# Patient Record
Sex: Male | Born: 1960 | Race: White | Hispanic: No | Marital: Married | Smoking: Former smoker
Health system: Southern US, Community
[De-identification: ages and names within clinical notes are randomized; demographics above are authoritative.]

## PROBLEM LIST (undated history)

## (undated) DIAGNOSIS — I739 Peripheral vascular disease, unspecified: Secondary | ICD-10-CM

## (undated) DIAGNOSIS — E119 Type 2 diabetes mellitus without complications: Secondary | ICD-10-CM

## (undated) HISTORY — DX: Peripheral vascular disease, unspecified: I73.9

---

## 2015-05-15 ENCOUNTER — Other Ambulatory Visit: Payer: Self-pay | Admitting: Physical Medicine and Rehabilitation

## 2015-05-15 DIAGNOSIS — M5416 Radiculopathy, lumbar region: Secondary | ICD-10-CM

## 2015-05-16 ENCOUNTER — Other Ambulatory Visit: Payer: Self-pay | Admitting: Physical Medicine and Rehabilitation

## 2015-05-16 ENCOUNTER — Ambulatory Visit
Admission: RE | Admit: 2015-05-16 | Discharge: 2015-05-16 | Disposition: A | Discharge status: Home / Self Care | Payer: 59 | Source: Ambulatory Visit | Attending: Physical Medicine and Rehabilitation | Admitting: Physical Medicine and Rehabilitation

## 2015-05-16 DIAGNOSIS — M5416 Radiculopathy, lumbar region: Secondary | ICD-10-CM

## 2015-05-16 HISTORY — PX: EPIDURAL BLOCK INJECTION: SHX1516

## 2015-05-16 MED ORDER — IOHEXOL 180 MG/ML  SOLN
1.0000 mL | Freq: Once | INTRAMUSCULAR | Status: DC | PRN
  Administered 2015-05-16: 1 mL via EPIDURAL

## 2015-05-16 MED ORDER — METHYLPREDNISOLONE ACETATE 40 MG/ML INJ SUSP (RADIOLOG
120.0000 mg | Freq: Once | INTRAMUSCULAR | Status: AC
  Administered 2015-05-16: 120 mg via EPIDURAL

## 2015-05-16 NOTE — Discharge Instructions (Signed)

## 2015-05-25 ENCOUNTER — Ambulatory Visit (HOSPITAL_BASED_OUTPATIENT_CLINIC_OR_DEPARTMENT_OTHER)
Admission: RE | Admit: 2015-05-25 | Discharge: 2015-05-25 | Disposition: A | Discharge status: Home / Self Care | Payer: 59 | Source: Ambulatory Visit | Attending: Neurosurgery | Admitting: Neurosurgery

## 2015-05-25 ENCOUNTER — Other Ambulatory Visit (HOSPITAL_BASED_OUTPATIENT_CLINIC_OR_DEPARTMENT_OTHER): Payer: Self-pay | Admitting: Neurosurgery

## 2015-05-25 ENCOUNTER — Encounter (HOSPITAL_BASED_OUTPATIENT_CLINIC_OR_DEPARTMENT_OTHER): Payer: Self-pay

## 2015-05-25 DIAGNOSIS — R1900 Intra-abdominal and pelvic swelling, mass and lump, unspecified site: Secondary | ICD-10-CM

## 2015-05-25 DIAGNOSIS — K760 Fatty (change of) liver, not elsewhere classified: Secondary | ICD-10-CM | POA: Insufficient documentation

## 2015-05-25 DIAGNOSIS — R109 Unspecified abdominal pain: Secondary | ICD-10-CM | POA: Diagnosis present

## 2015-05-25 HISTORY — DX: Type 2 diabetes mellitus without complications: E11.9

## 2015-05-25 MED ORDER — IOHEXOL 300 MG/ML  SOLN
100.0000 mL | Freq: Once | INTRAMUSCULAR | Status: AC | PRN
  Administered 2015-05-25: 100 mL via INTRAVENOUS

## 2015-05-29 ENCOUNTER — Encounter: Payer: Self-pay | Admitting: Vascular Surgery

## 2015-05-30 ENCOUNTER — Encounter: Payer: Self-pay | Admitting: Vascular Surgery

## 2015-05-31 ENCOUNTER — Ambulatory Visit (INDEPENDENT_AMBULATORY_CARE_PROVIDER_SITE_OTHER): Payer: 59 | Admitting: Vascular Surgery

## 2015-05-31 ENCOUNTER — Other Ambulatory Visit: Payer: Self-pay

## 2015-05-31 ENCOUNTER — Encounter: Payer: Self-pay | Admitting: Vascular Surgery

## 2015-05-31 ENCOUNTER — Telehealth: Payer: Self-pay | Admitting: Vascular Surgery

## 2015-05-31 VITALS — BP 125/87 | HR 111 | Temp 98.2°F | Resp 18 | Ht 71.0 in | Wt 133.0 lb

## 2015-05-31 DIAGNOSIS — G8929 Other chronic pain: Secondary | ICD-10-CM

## 2015-05-31 DIAGNOSIS — R1031 Right lower quadrant pain: Secondary | ICD-10-CM | POA: Diagnosis not present

## 2015-05-31 DIAGNOSIS — R109 Unspecified abdominal pain: Secondary | ICD-10-CM

## 2015-05-31 NOTE — Telephone Encounter (Signed)
notified patient's wife of appointment at [facility] for an gastric emptying study, gave instructions:  NPO after midnight, go to 1st Radiology Dept. at 6:45am

## 2015-05-31 NOTE — Progress Notes (Signed)
[facility] [place] HISTORY AND PHYSICAL   History of Present Illness:  Patient is a 54 y.o. male who presents for evaluation of weight loss and right side pain. The patient complains of right lower quadrant/trunk pain which occurs with touching the skin or muscles on the right side. He does not really describe postprandial abdominal pain. He was recently evaluated by Dr. Dutch QuintPoole for back pain and is being considered for lumbar spine surgery. He has also lost 50 pounds in the last 8 months. He has also had a recent diagnosis of diabetes. Workup for his weight loss consisted of an endocrinologic workup as well as a colonoscopy. All of these have not really pointed to any cause of the weight loss. The colonoscopy was done in July at [facility]. The patient states a polyp was removed otherwise the colonoscopy was normal. The patient does have early satiety. He does not describe food fear. The patient states he has otherwise been healthy. He is a former smoker and quit 2 years ago. He denies history of elevated cholesterol or hypertension. He denies nausea vomiting diarrhea or constipation symptoms. He has had no prior abdominal surgery.  Past Medical History  Diagnosis Date  . Diabetes mellitus without complication (HCC)   . Peripheral vascular disease [facility](HCC)     Past Surgical History  Procedure Laterality Date  . Epidural block injection  05-16-15    Back    Social History Social History  Substance Use Topics  . Smoking status: Former Smoker -- 1.00 packs/day for 35 years    Types: Cigarettes    Quit date: 03/15/2013  . Smokeless tobacco: Never Used  . Alcohol Use: No    Family History Family History  Problem Relation Age of Onset  . Cancer Mother   . Diabetes Mother   . Heart disease Mother     After age 52  . Cancer Father   . Diabetes Father   . Diabetes Brother     Allergies  Allergies  Allergen Reactions  . Prednisone     Other reaction(s): Other (See  Comments) "Diabetic state"     Current Outpatient Prescriptions  Medication Sig Dispense Refill  . cyclobenzaprine (FLEXERIL) 10 MG tablet Take 2-3 per day  0  . HYDROcodone-acetaminophen (NORCO) 10-325 MG tablet every 1 hour x 6 doses.  0  . metFORMIN (GLUCOPHAGE) 500 MG tablet Take 1,000 mg by mouth.    . pioglitazone (ACTOS) 15 MG tablet Take 15 mg by mouth.     No current facility-administered medications for this visit.    ROS:   General:  + weight loss, Fever, chills  HEENT: No recent headaches, no nasal bleeding, no visual changes, no sore throat  Neurologic: No dizziness, blackouts, seizures. No recent symptoms of stroke or mini- stroke. No recent episodes of slurred speech, or temporary blindness.  Cardiac: No recent episodes of chest pain/pressure, no shortness of breath at rest.  No shortness of breath with exertion.  Denies history of atrial fibrillation or irregular heartbeat  Vascular: No history of rest pain in feet.  No history of claudication.  No history of non-healing ulcer, No history of DVT   Pulmonary: No home oxygen, no productive cough, no hemoptysis,  No asthma or wheezing  Musculoskeletal:  [ ]  Arthritis, [ ]  Low back pain,  [ ]  Joint pain  Hematologic:No history of hypercoagulable state.  No history of easy bleeding.  No history of anemia  Gastrointestinal: No hematochezia or melena,  No gastroesophageal reflux, no trouble swallowing  Urinary:  chronic Kidney disease,  on HD -  MWF or  TTHS,  Burning with urination,  Frequent urination,  Difficulty urinating;   Skin: No rashes  Psychological: No history of anxiety,  No history of depression   Physical Examination  Filed Vitals:   05/31/15 1332  BP: 125/87  Pulse: 111  Temp: 98.2 F (36.8 C)  TempSrc: Oral  Resp: 18  Height:  (1.803 m)  Weight: 133 lb (60.328 kg)  SpO2: 100%    Body mass index is 18.56 kg/(m^2).  General:  Alert and oriented, no acute  distress HEENT: Normal Neck: No bruit or JVD Pulmonary: Clear to auscultation bilaterally Cardiac: Regular Rate and Rhythm without murmur Abdomen: Soft, non-tender, non-distended, no mass, no scars Skin: No rash Extremity Pulses:  2+ radial, brachial, femoral, dorsalis pedis, posterior tibial pulses bilaterally Musculoskeletal: No deformity or edema  Neurologic: Upper and lower extremity motor 5/5 and symmetric  DATA:  I reviewed the patient's recent CT scan of the abdomen and pelvis dated 05/25/2015. This shows an irregular narrowing of the celiac artery approximately 80%. Superior mesenteric artery is widely patent. The inferior mesenteric artery is patent but small but not really much smaller than most people's inferior mesenteric artery.  ASSESSMENT:  Unexplained weight loss with right-sided abdominal pain although the patient describes this more as musculoskeletal pain rather than visceral pain. 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 However his superior mesenteric artery is widely patent and his IMA is patent as well. It would be unusual to have this type of weight loss symptoms from an isolated celiac artery stenosis.   PLAN:  We will order a gastric emptying study to see whether or not gastroparesis may be part of his problem secondary to his diabetes. If the gastric emptying study is negative. He may need further evaluation by his GI doctor before we would consider his celiac artery stenosis as the source of his weight loss. The patient will return for follow-up after his gastric emptying study.  Fabienne Bruns, MD [facility] [place] Office: [phone] Pager: [phone]

## 2015-06-08 ENCOUNTER — Encounter: Payer: Self-pay | Admitting: Neurology

## 2015-06-08 ENCOUNTER — Ambulatory Visit (INDEPENDENT_AMBULATORY_CARE_PROVIDER_SITE_OTHER): Payer: 59 | Admitting: Neurology

## 2015-06-08 VITALS — BP 132/85 | HR 102 | Ht 71.0 in | Wt 134.8 lb

## 2015-06-08 DIAGNOSIS — M5416 Radiculopathy, lumbar region: Secondary | ICD-10-CM | POA: Diagnosis not present

## 2015-06-08 DIAGNOSIS — R634 Abnormal weight loss: Secondary | ICD-10-CM

## 2015-06-08 DIAGNOSIS — R29898 Other symptoms and signs involving the musculoskeletal system: Secondary | ICD-10-CM | POA: Diagnosis not present

## 2015-06-08 DIAGNOSIS — M6250 Muscle wasting and atrophy, not elsewhere classified, unspecified site: Secondary | ICD-10-CM

## 2015-06-08 DIAGNOSIS — G609 Hereditary and idiopathic neuropathy, unspecified: Secondary | ICD-10-CM | POA: Diagnosis not present

## 2015-06-08 MED ORDER — GABAPENTIN 300 MG PO CAPS: 300.0000 mg | ORAL_CAPSULE | Freq: Three times a day (TID) | ORAL | Status: DC

## 2015-06-08 NOTE — Patient Instructions (Signed)
As far as your medications are concerned, I would like to suggest: Gabapentin 300mg  three times daily  As far as diagnostic testing: labs  I would like to see you back in 3 months, sooner if we need to. Please call us with any interim questions, concerns, problems, updates or refill requests.   Please also call us for any test results so we can go over those with you on the phone.  My clinical assistant and will answer any of your questions and relay your messages to me and also relay most of my messages to you.   Our phone number is [phone]. We also have an after hours call service for urgent matters and there is a physician on-call for urgent questions. For any emergencies you know to call 911 or go to the nearest emergency room

## 2015-06-08 NOTE — Progress Notes (Addendum)
[facility]    Provider:  Dr Micheal Richardson Referring Provider: Delilah Shan, MD Primary Care Physician:  Micheal Simmer, MD  CC:  Peripheral polyneuropathy  HPI:  Micheal Richardson is a 54 y.o. male here as a referral from Dr. Claiborne Richardson for peripheral polyneuropathy. He was recently diagnosed with diabetes with a hemoglobin A1c. 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 He had lost 30 pounds before being diagnosed and has lost 50 pounds altogether. Patient has numbness and tingling in the feet which started back in June. Started after lifting water out of the back of the car and afterwards with back pain, numbness and tinlging on the right foot at the toes. Pain shoots down the side of the right leg through the thigh and all the way down the lateral part of the leg to the toes. He was diagnosed with diabetes in June as well. His HgbA1c was 11.2 and his glucose was 375. He has had a 50 pound weight loss. His primary care has been evaluating patient for this weight loss. Patient has an endocrinologist who has worked him up for possible malignancy, has had extensive imaging and pet scans and colonoscopy. He has not had an MRI of the brain. He has had B12 and folate and TSH labs and has been tested extensively. He describes no cramping in the feet. He feels weakness distally in the right foot. He has no symptoms in the left leg foot. The pad of the right foot feels swollen. Balance is fine. His hands and feet get cold. No paresthesias or numbness in the left toes or any fingers. No other focal neurologic symptoms.  Reviewed notes, labs and imaging from outside physicians, which showed: Patient was seen at [facility]. He was seen there for severe right-sided low back pain shooting into the right lower extremity into the sole of the foot. He has had L4/L5, L5-S1 and S1 transforaminal epidurals at [facility] that did not give him any relief whatsoever. He had an MRI revealing a small right  L5-S1 paracentral disc protrusion with S1 encroachment in the lateral recess. During this time he was also diagnosed with diabetes in June with rather high elevated sugars with an A1c in the 11 range. He was also experiencing rapid weight loss which unfortunately continues. He was sent to vascular surgery for celiac artery stenosis seen on an abdominal CT scan.  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 Findings are fairly symmetric bilaterally which included absent sural sensory responses, prolonged motor latencies with leg nerve conductions slowing, chronic neurogenic motor unit changes with motor unit dropout in the foot intrinsics. Consistent with diabetes also many other etiologies including a neoplastic process. Study was negative for any lumbar radiculopathy, plexopathy, peripheral tibial or peroneal mononeuropathy as well as myopathy.  Personally reviewed data from the electrodiagnostic laboratory report and agree with conclusion detailed above. Personally reviewed MRI of the lumbar spine images a patient brought on disc and agree with report above.  CMP unremarkable showed creatinine of 0.84. TSH within normal limits.  Review of Systems: Patient complains of symptoms per HPI as well as the following symptoms: Weight loss, fatigue, numbness, weakness. Pertinent negatives per HPI. All others negative.   Social History   Social History  . Marital Status: Married    Spouse Name: N/A  . Number of Children: N/A  . Years of Education: N/A   Occupational History  .  Not on file.   Social History Main Topics  . Smoking status: Former Smoker -- 1.00 packs/day for 35 years    Types: Cigarettes    Quit date: 03/15/2013  . Smokeless tobacco: Never Used  . Alcohol Use: No  . Drug Use: No  . Sexual Activity: Not on file   Other Topics Concern  . Not  on file   Social History Narrative    Family History  Problem Relation Age of Onset  . Cancer Mother   . Diabetes Mother   . Heart disease Mother     After age 51  . Cancer Father   . Diabetes Father   . Diabetes Brother   . Neuropathy Neg Hx     Past Medical History  Diagnosis Date  . Diabetes mellitus without complication ([place])   . Peripheral vascular disease [facility])     Past Surgical History  Procedure Laterality Date  . Epidural block injection  05-16-15    Back    Current Outpatient Prescriptions  Medication Sig Dispense Refill  . Blood Glucose Monitoring Suppl (ONETOUCH VERIO FLEX SYSTEM) W/DEVICE KIT 1 each by Does not apply route.    . cyclobenzaprine (FLEXERIL) 10 MG tablet Take 2-3 per day  0  . HYDROcodone-acetaminophen (NORCO) 10-325 MG tablet Take 1 tablet by mouth every 6 (six) hours as needed.   0  . metFORMIN (GLUCOPHAGE) 500 MG tablet Take 2,000 mg by mouth.     Glory Rosebush DELICA LANCETS 40C MISC USE TID  5  . pioglitazone (ACTOS) 30 MG tablet Take 30 mg by mouth.    . gabapentin (NEURONTIN) 300 MG capsule Take 1 capsule (300 mg total) by mouth 3 (three) times daily. 90 capsule 11   No current facility-administered medications for this visit.    Allergies as of 06/08/2015 - Review Complete 06/08/2015  Allergen Reaction Noted  . Prednisone Other (See Comments) 05/29/2015    Vitals: BP 132/85 mmHg  Pulse 102  Ht 5' 11"  (1.803 m)  Wt 134 lb 12.8 oz (61.145 kg)  BMI 18.81 kg/m2 Last Weight:  Wt Readings from Last 1 Encounters:  06/08/15 134 lb 12.8 oz (61.145 kg)   Last Height:   Ht Readings from Last 1 Encounters:  06/08/15 5' 11"  (1.803 m)    Physical exam: Exam: Gen: NAD, conversant, well nourised, obese, well groomed                     CV: RRR, no MRG. No Carotid Bruits. No peripheral edema, warm, nontender Eyes: Conjunctivae clear without exudates or hemorrhage  Neuro: Detailed Neurologic Exam  Speech:    Speech is normal; fluent  and spontaneous with normal comprehension.  Cognition:    The patient is oriented to person, place, and time;     recent and remote memory intact;     language fluent;     normal attention, concentration,     fund of knowledge Cranial Nerves:    The pupils are equal, round, and reactive to light. The fundi are flat. Visual fields are full to finger confrontation. Extraocular movements are intact. Trigeminal sensation is intact and the muscles of mastication are normal. The face is symmetric. The palate elevates in the midline. Hearing intact. Voice is normal. Shoulder shrug is normal. The tongue has normal motion without fasciculations.   Coordination:    Normal finger to nose and heel to shin.  Gait:    Heel-toe  are normal.   Motor  Observation:    Generalized atrophy. no involuntary movements noted. Tone:    Normal muscle tone.    Posture:    Posture is normal. normal erect    Strength:     left deltoid 4+/5, Right deltoid 4/5 otherwise strength is V/V in the upper and lower limbs.      Sensation: Dec pin prick and temp to ankles, intact in the upper extremities distally. Vibration and proprioception intact distally.      Reflex Exam:  DTR's:     Deep tendon reflexes in the lower extremities are absent bilaterally.  DTRs in the uppers are 1+. Toes:    The toes are downgoing bilaterally.   Clonus:    Clonus is absent.   Assessment/Plan:   54 y.o. male here as a referral from Dr. Claiborne Richardson for peripheral polyneuropathy. He was recently diagnosed with diabetes with a hemoglobin A1c of over 11 and celiac artery stenosis. He had lost 30 pounds before being diagnosed and has lost 50 pounds altogether. He describes low back pain and right sided radicular symptoms radiating posteriorly down to the right foot. He denies any paresthesias in the other limbs but does report coldness in the feet and hands.   Patient does have a distal peripheral polyneuropathy, more small fiber on clinical  exam however on EMG/nerve conduction studies showed sensory motor involvement. This is likely due to his diabetes, unclear how long it was uncontrolled for, however will order extensive peripheral neuropathy screen including paraneoplastic screen given his weight loss  However the right-sided leg symptoms do sound like S1 nerve root involvement related to his right L5-S1 paracentral disc herniation and are unlikely caused by his distal axonal sensorimotor peripheral polyneuropathy.   -Physical therapy for radicular pain in the right leg and weakness -Peripheral Neuropathy: will order extensive peripheral neuropathy screen including paraneoplastic screen given his weight loss -Right-sided leg radicular symptoms: do sound like S1 nerve root involvement related to his right L5-S1 paracentral disc herniation - Will start Neurontin for pain, can titrate up as needed - Refer to Dr. Brien Few for Evaluation and treatment, and for S1 nerve root injection  CC: Dr. Claiborne Richardson, Dr. Philemon Kingdom, MD  [facility] [address] [place], [place] [address]  Phone [phone] Fax [phone]

## 2015-06-09 ENCOUNTER — Encounter: Payer: Self-pay | Admitting: Neurology

## 2015-06-11 ENCOUNTER — Telehealth: Payer: Self-pay | Admitting: Neurology

## 2015-06-11 ENCOUNTER — Encounter: Payer: Self-pay | Admitting: Neurology

## 2015-06-11 NOTE — Addendum Note (Signed)
Addended by: Naomie DeanAHERN, Marquie Aderhold B on: 06/11/2015 10:02 PM   Modules accepted: Orders

## 2015-06-11 NOTE — Telephone Encounter (Signed)
Alwyn RenEmma and Daniel:  Patient needs to be referred to pain management ([facility]: Callie FieldingBartko Albert MD). He is emailing about the status. Can you get this referral to Dr. Murray HodgkinsBartko on Monday please? Then Kara Meadmma can call patient and let him know that the referral has been faxed so he can call dr. Murray HodgkinsBartko please? Dr. Murray HodgkinsBartko is aware and is waiting on the referral. Include my office note. emm thanks.   Thank you both!

## 2015-06-12 ENCOUNTER — Ambulatory Visit (HOSPITAL_COMMUNITY)
Admission: RE | Admit: 2015-06-12 | Discharge: 2015-06-12 | Disposition: A | Discharge status: Home / Self Care | Payer: 59 | Source: Ambulatory Visit | Attending: Vascular Surgery | Admitting: Vascular Surgery

## 2015-06-12 DIAGNOSIS — R109 Unspecified abdominal pain: Secondary | ICD-10-CM

## 2015-06-12 DIAGNOSIS — R634 Abnormal weight loss: Secondary | ICD-10-CM | POA: Insufficient documentation

## 2015-06-12 MED ORDER — TECHNETIUM TC 99M SULFUR COLLOID: 2.0000 | Freq: Once | INTRAVENOUS | Status: DC | PRN

## 2015-06-12 NOTE — Telephone Encounter (Signed)
Sent message to pt through mychart to respond to question on referral.

## 2015-06-12 NOTE — Telephone Encounter (Signed)
Sent referral to Dr Murray HodgkinsBartko. Received fax confirmation.

## 2015-06-12 NOTE — Telephone Encounter (Signed)
Thank you :)

## 2015-06-13 ENCOUNTER — Other Ambulatory Visit: Payer: Self-pay | Admitting: Neurology

## 2015-06-13 ENCOUNTER — Encounter: Payer: Self-pay | Admitting: Vascular Surgery

## 2015-06-13 MED ORDER — GABAPENTIN 300 MG PO CAPS: 600.0000 mg | ORAL_CAPSULE | Freq: Three times a day (TID) | ORAL | Status: DC

## 2015-06-14 LAB — HEAVY METALS, BLOOD
ARSENIC: 5 ug/L (ref 2–23)
Lead, Blood: NOT DETECTED ug/dL (ref 0–19)
MERCURY: NOT DETECTED ug/L (ref 0.0–14.9)

## 2015-06-14 LAB — COMPREHENSIVE METABOLIC PANEL
ALT: 18 IU/L (ref 0–44)
AST: 14 IU/L (ref 0–40)
Albumin/Globulin Ratio: 1.6 (ref 1.1–2.5)
Albumin: 4.2 g/dL (ref 3.5–5.5)
Alkaline Phosphatase: 80 IU/L (ref 39–117)
BILIRUBIN TOTAL: 0.4 mg/dL (ref 0.0–1.2)
BUN/Creatinine Ratio: 19 (ref 9–20)
BUN: 18 mg/dL (ref 6–24)
CHLORIDE: 100 mmol/L (ref 97–106)
CO2: 27 mmol/L (ref 18–29)
Calcium: 10.1 mg/dL (ref 8.7–10.2)
Creatinine, Ser: 0.95 mg/dL (ref 0.76–1.27)
GFR calc non Af Amer: 92 mL/min/{1.73_m2} (ref >59)
GFR, EST AFRICAN AMERICAN: 106 mL/min/{1.73_m2} (ref >59)
GLUCOSE: 159 mg/dL — AB (ref 65–99)
Globulin, Total: 2.7 g/dL (ref 1.5–4.5)
Potassium: 5.3 mmol/L — ABNORMAL HIGH (ref 3.5–5.2)
Sodium: 141 mmol/L (ref 136–144)
TOTAL PROTEIN: 6.9 g/dL (ref 6.0–8.5)

## 2015-06-14 LAB — MULTIPLE MYELOMA PANEL, SERUM
ALBUMIN/GLOB SERPL: 1.3 (ref 0.7–1.7)
ALPHA 1: 0.2 g/dL (ref 0.0–0.4)
ALPHA2 GLOB SERPL ELPH-MCNC: 0.7 g/dL (ref 0.4–1.0)
Albumin SerPl Elph-Mcnc: 3.8 g/dL (ref 2.9–4.4)
B-GLOBULIN SERPL ELPH-MCNC: 1.2 g/dL (ref 0.7–1.3)
GLOBULIN, TOTAL: 3.1 g/dL (ref 2.2–3.9)
Gamma Glob SerPl Elph-Mcnc: 0.9 g/dL (ref 0.4–1.8)
IGA/IMMUNOGLOBULIN A, SERUM: 368 mg/dL (ref 90–386)
IGG (IMMUNOGLOBIN G), SERUM: 793 mg/dL (ref 700–1600)
IGM (IMMUNOGLOBULIN M), SRM: 69 mg/dL (ref 20–172)

## 2015-06-14 LAB — HEPATITIS C ANTIBODY

## 2015-06-14 LAB — ANA COMPREHENSIVE PANEL
Anti JO-1: <0.2 AI (ref 0.0–0.9)
Chromatin Ab SerPl-aCnc: <0.2 AI (ref 0.0–0.9)
DSDNA AB: 1 [IU]/mL (ref 0–9)
ENA SM Ab Ser-aCnc: <0.2 AI (ref 0.0–0.9)
ENA SSA (RO) Ab: <0.2 AI (ref 0.0–0.9)
Scleroderma SCL-70: <0.2 AI (ref 0.0–0.9)

## 2015-06-14 LAB — B12 AND FOLATE PANEL
Folate: 19.9 ng/mL (ref >3.0)
Vitamin B-12: 545 pg/mL (ref 211–946)

## 2015-06-14 LAB — GLIADIN ANTIBODIES, SERUM
ANTIGLIADIN ABS, IGA: 9 U (ref 0–19)
GLIADIN IGG: 2 U (ref 0–19)

## 2015-06-14 LAB — VITAMIN B1: THIAMINE: 161.5 nmol/L (ref 66.5–200.0)

## 2015-06-14 LAB — HIV ANTIBODY (ROUTINE TESTING W REFLEX): HIV Screen 4th Generation wRfx: NONREACTIVE

## 2015-06-14 LAB — ANA: ANA TITER 1: NEGATIVE

## 2015-06-14 LAB — SEDIMENTATION RATE: Sed Rate: 2 mm/hr (ref 0–30)

## 2015-06-14 LAB — VITAMIN B6: Vitamin B6: 15.9 ug/L (ref 5.3–46.7)

## 2015-06-14 LAB — B. BURGDORFI ANTIBODIES: Lyme IgG/IgM Ab: <0.91 {ISR} (ref 0.00–0.90)

## 2015-06-14 LAB — RHEUMATOID FACTOR: Rhuematoid fact SerPl-aCnc: <10 IU/mL (ref 0.0–13.9)

## 2015-06-14 LAB — PARANEOPLASTIC PROFILE 1

## 2015-06-14 LAB — RPR: RPR Ser Ql: NONREACTIVE

## 2015-06-14 LAB — TISSUE TRANSGLUTAMINASE, IGA: Transglutaminase IgA: 5 U/mL — ABNORMAL HIGH (ref 0–3)

## 2015-06-14 LAB — METHYLMALONIC ACID, SERUM: METHYLMALONIC ACID: 186 nmol/L (ref 0–378)

## 2015-06-15 ENCOUNTER — Ambulatory Visit (INDEPENDENT_AMBULATORY_CARE_PROVIDER_SITE_OTHER): Payer: 59 | Admitting: Vascular Surgery

## 2015-06-15 ENCOUNTER — Encounter: Payer: Self-pay | Admitting: Vascular Surgery

## 2015-06-15 VITALS — BP 128/87 | HR 94 | Ht 71.0 in | Wt 135.4 lb

## 2015-06-15 DIAGNOSIS — R1010 Upper abdominal pain, unspecified: Secondary | ICD-10-CM

## 2015-06-15 NOTE — Progress Notes (Signed)
Patient is a 54 year old male who returns for follow-up today. 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 At his previous office visit I sent him for a gastric emptying study based on his symptoms of early satiety and weight loss. He also has a history of diabetes. He reports since I saw him in the office several weeks ago his symptoms have basically resolved at this point. The current thinking is that he may have been having an interaction between tramadol and metformin. His appetite has returned. He is gaining weight. He has no further abdominal pain.   Review of systems: He denies constipation or diarrhea. He denies any GI symptoms at all of this point.   Physical exam:  Filed Vitals:   06/15/15 1149  BP: 128/87  Pulse: 94  Height: 5\' 11"  (1.803 m)  Weight: 135 lb 6.4 oz (61.417 kg)  SpO2: 100%     Data: Gastric emptying study results were reviewed this was a normal study. No evidence of gastric emptying delay.  Assessment: abdominal pain and weight loss symptoms currently resolved. Patient with isolated celiac artery stenosis. It is rare that isolated celiac artery stenosis can cause mesenteric ischemia symptoms. If the patient's symptoms return before consideration on an intervention for mesenteric artery stenosis he would need a full GI workup to rule out other possible causes as again isolated celiac artery stenosis would be a rare cause of abdominal pain. All this was discussed with the patient today. He will follow-up with me on an as-needed basis.   Plan: See above  Fabienne Brunsharles Fields, MD [facility] [place] Office: [phone] Pager: [phone]

## 2015-06-19 ENCOUNTER — Telehealth: Payer: Self-pay | Admitting: *Deleted

## 2015-06-19 ENCOUNTER — Other Ambulatory Visit: Payer: Self-pay | Admitting: Neurology

## 2015-06-19 MED ORDER — PREGABALIN 75 MG PO CAPS: 75.0000 mg | ORAL_CAPSULE | Freq: Two times a day (BID) | ORAL | Status: DC

## 2015-06-19 NOTE — Telephone Encounter (Signed)
Patient was given the message that his results were unremarkable and accepted information with understanding. He would like for the doctor to know that his Rx Gabapentin is not helping and would like to try another medication.  Thanks!

## 2015-06-19 NOTE — Telephone Encounter (Signed)
We can try some Lyrica. Let him know please. Will start low at 75mg  BID and can increase from there. Will fax it in for him in the morning. Side effects to Lyrica includes serious reaction such as hypersensitivity, angioedema, Stevens-Johnson syndrome and rash, rhabdomyolysis, suicidality as well as the common reactions which include dizziness, somnolence, dry mouth, peripheral edema, weight gain, abnormal thinking, constipation, pain, impaired coordination and decreased platelets.

## 2015-06-19 NOTE — Telephone Encounter (Signed)
LVM for pt to call about results. Gave [facility] phone number and hours. Okay to inform pt labs unremarkable.

## 2015-06-19 NOTE — Telephone Encounter (Signed)
-----   Message from Anson FretAntonia B Ahern, MD sent at 06/18/2015  6:43 PM EST ----- Let patient know all labs were unremarkable.Thanks

## 2015-06-20 ENCOUNTER — Other Ambulatory Visit: Payer: Self-pay | Admitting: Neurology

## 2015-06-20 MED ORDER — DULOXETINE HCL 60 MG PO CPEP: 60.0000 mg | ORAL_CAPSULE | Freq: Every day | ORAL | Status: DC

## 2015-06-20 NOTE — Telephone Encounter (Signed)
Micheal Richardson, let him know I called him in cymbalta. The most common side effects of Cymbalta are nausea, dry mouth, constipation, diarrhea, fatigue, drowsiness, difficulty sleeping, loss of appetite, and dizziness. Some patients may experience withdrawal reactions such anxiety, nausea, nervousness, and insomnia

## 2015-06-20 NOTE — Telephone Encounter (Signed)
LVM for pt to call back. Gave [facility] phone number and hours.  

## 2015-06-20 NOTE — Telephone Encounter (Signed)
Pt returned call. Relayed Dr Lucia GaskinsAhern message below. He is hesitant about starting Lyrica because he has spoken with several people who have tried this but had bad side effects from medication. I went over common/serious side effects. He wanted to see if there was another medication Dr Lucia GaskinsAhern could try before Lyrica. I told him I will speak with her and call him back. He verbalized understanding. He said if there was not another option, he will go ahead and try the Lyrica.

## 2015-06-21 ENCOUNTER — Other Ambulatory Visit: Payer: Self-pay | Admitting: *Deleted

## 2015-06-21 NOTE — Telephone Encounter (Signed)
Patient returned call

## 2015-06-21 NOTE — Telephone Encounter (Signed)
LVM for pharmacist at pt pharmacy to cx rx lyrica per pt request. Pt does not want to try this medication, but the cymbalta instead. Gave pt name and DOB and [facility] phone number if they have further questions.

## 2015-06-21 NOTE — Telephone Encounter (Signed)
Called pt and relayed Dr Lucia GaskinsAhern message about Cymbalta and side effects that can occur. Told him to call us if he experiences any. Ready to be picked up at pharmacy. Told him I will call his pharmacy to let him know I will tell them to not fill the Lyrica. He verbalized understanding.

## 2015-06-21 NOTE — Telephone Encounter (Signed)
LVM for pt to call back. Gave [facility] phone number and hours.  

## 2015-07-12 ENCOUNTER — Other Ambulatory Visit: Payer: Self-pay | Admitting: *Deleted

## 2015-07-12 DIAGNOSIS — R1012 Left upper quadrant pain: Principal | ICD-10-CM

## 2015-07-12 DIAGNOSIS — G8929 Other chronic pain: Secondary | ICD-10-CM

## 2015-07-12 DIAGNOSIS — R1011 Right upper quadrant pain: Principal | ICD-10-CM

## 2015-08-31 ENCOUNTER — Encounter: Payer: Self-pay | Admitting: Neurology

## 2015-08-31 ENCOUNTER — Ambulatory Visit (INDEPENDENT_AMBULATORY_CARE_PROVIDER_SITE_OTHER): Payer: 59 | Admitting: Neurology

## 2015-08-31 VITALS — BP 107/66 | HR 86 | Ht 71.0 in | Wt 140.8 lb

## 2015-08-31 DIAGNOSIS — G2581 Restless legs syndrome: Secondary | ICD-10-CM

## 2015-08-31 DIAGNOSIS — E0842 Diabetes mellitus due to underlying condition with diabetic polyneuropathy: Secondary | ICD-10-CM | POA: Diagnosis not present

## 2015-08-31 DIAGNOSIS — E1140 Type 2 diabetes mellitus with diabetic neuropathy, unspecified: Secondary | ICD-10-CM | POA: Insufficient documentation

## 2015-08-31 MED ORDER — ROTIGOTINE 1 MG/24HR TD PT24: 1.0000 | MEDICATED_PATCH | Freq: Every evening | TRANSDERMAL | Status: DC | PRN

## 2015-08-31 NOTE — Progress Notes (Signed)
[id number]    Provider:  Dr Jaynee Eagles Referring Provider: Delilah Shan, MD Primary Care Physician:  Nilda Simmer, MD  CC: Peripheral polyneuropathy  Interval history 08/31/2015: He has gained weight. Feeling better. Pain in the legs is ok, he still has some tingling but he is doing wll on the cymbalta 65m. When he is sleeping at night his leg feel restless. He is constabtly tapping and moving legs. He had ESI with Dr. BBrien Fewand felt much better for his radiculopathy in the pelvic joint. He played golf last Friday.   HPI: Micheal Richardson a 55y.o. male here as a referral from Dr. KClaiborne Billingsfor peripheral polyneuropathy. He was recently diagnosed with diabetes with a hemoglobin A1c. 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 He had lost 30 pounds before being diagnosed and has lost 50 pounds altogether. Micheal Richardson has numbness and tingling in the feet which started back in June. Started after lifting water out of the back of the car and afterwards with back pain, numbness and tinlging on the right foot at the toes. Pain shoots down the side of the right leg through the thigh and all the way down the lateral part of the leg to the toes. He was diagnosed with diabetes in June as well. His HgbA1c was 11.2 and his glucose was 375. He has had a 50 pound weight loss. His primary care has been evaluating Micheal Richardson for this weight loss. Micheal Richardson has an endocrinologist who has worked him up for possible malignancy, has had extensive imaging and pet scans and colonoscopy. He has not had an MRI of the brain. He has had B12 and folate and TSH labs and has been tested extensively. He describes no cramping in the feet. He feels weakness distally in the right foot. He has no symptoms in the left leg foot. The pad of the right foot feels swollen. Balance is fine. His hands and feet get cold. No paresthesias or numbness in the left toes or any fingers. No other focal neurologic symptoms.  Reviewed notes, labs and imaging  from outside physicians, which showed: [facility] He was seen there for severe right-sided low back pain shooting into the right lower extremity into the sole of the foot. He has had L4/L5, L5-S1 and [facility] [facility] whatsoever. He had an MRI revealing a small right L5-S1 paracentral disc protrusion with S1 encroachment in the lateral recess. During this time he was also diagnosed with diabetes in June with rather high elevated sugars with an A1c in the 11 range. He was also experiencing rapid weight loss which unfortunately continues. He was sent to vascular surgery for celiac artery stenosis seen on an abdominal CT scan.  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 Findings are fairly symmetric bilaterally which included absent sural sensory responses, prolonged motor latencies with leg nerve conductions slowing, chronic neurogenic motor unit changes with motor unit dropout in the foot intrinsics. Consistent with diabetes also many other etiologies including a neoplastic process. Study was negative for any lumbar radiculopathy, plexopathy, peripheral tibial or peroneal mononeuropathy as well as myopathy.  Personally reviewed data from the electrodiagnostic laboratory report and agree with conclusion detailed above. Personally reviewed MRI of the lumbar spine images a Micheal Richardson brought on disc and agree with report above.  CMP unremarkable showed creatinine of  0.84. TSH within normal limits.  Review of Systems: Micheal Richardson complains of symptoms per HPI as well as the following symptoms: Weight loss, fatigue, numbness, weakness. Pertinent negatives per HPI. All others negative.    Social History   Social History  . Marital Status: Married    Spouse Name: N/A    . Number of Children: N/A  . Years of Education: N/A   Occupational History  . Not on file.   Social History Main Topics  . Smoking status: Former Smoker -- 1.00 packs/day for 35 years    Types: Cigarettes    Quit date: 03/15/2013  . Smokeless tobacco: Never Used  . Alcohol Use: No  . Drug Use: No  . Sexual Activity: Not on file   Other Topics Concern  . Not on file   Social History Narrative    Family History  Problem Relation Age of Onset  . Cancer Mother   . Diabetes Mother   . Heart disease Mother     After age 7  . Cancer Father   . Diabetes Father   . Diabetes Brother   . Neuropathy Neg Hx     Past Medical History  Diagnosis Date  . Diabetes mellitus without complication ([place])   . Peripheral vascular disease [facility])     Past Surgical History  Procedure Laterality Date  . Epidural block injection  05-16-15    Back    Current Outpatient Prescriptions  Medication Sig Dispense Refill  . Blood Glucose Monitoring Suppl (ONETOUCH VERIO FLEX SYSTEM) W/DEVICE KIT 1 each by Does not apply route.    . DULoxetine (CYMBALTA) 60 MG capsule Take 1 capsule (60 mg total) by mouth daily. 30 capsule 11  . metFORMIN (GLUCOPHAGE) 500 MG tablet Take 2,000 mg by mouth.     Glory Rosebush DELICA LANCETS 20N MISC USE TID  5   No current facility-administered medications for this visit.    Allergies as of 08/31/2015 - Review Complete 08/31/2015  Allergen Reaction Noted  . Prednisone Other (See Comments) 05/29/2015    Vitals: BP 107/66 mmHg  Pulse 86  Ht 5' 11"  (1.803 m)  Wt 140 lb 12.8 oz (63.866 kg)  BMI 19.65 kg/m2 Last Weight:  Wt Readings from Last 1 Encounters:  08/31/15 140 lb 12.8 oz (63.866 kg)   Last Height:   Ht Readings from Last 1 Encounters:  08/31/15 5' 11"  (1.803 m)     Physical exam: Exam: Gen: NAD, conversant  CV: RRR, no MRG. No Carotid Bruits. No peripheral edema, warm, nontender Eyes: Conjunctivae clear without exudates or  hemorrhage  Neuro: Detailed Neurologic Exam  Speech:  Speech is normal; fluent and spontaneous with normal comprehension.  Cognition:  The Micheal Richardson is oriented to person, place, and time;   recent and remote memory intact;   language fluent;   normal attention, concentration,   fund of knowledge Cranial Nerves:  The pupils are equal, round, and reactive to light. Visual fields are full to finger confrontation. Extraocular movements are intact. Trigeminal sensation is intact and the muscles of mastication are normal. The face is symmetric. The palate elevates in the midline. Hearing intact. Voice is normal. Shoulder shrug is normal. The tongue has normal motion without fasciculations.   Motor Observation:  Generalized atrophy. no involuntary movements noted. Tone:  Normal muscle tone.   Posture:  Posture is normal. normal erect   Strength:  left deltoid 4+/5, Right deltoid 4/5 otherwise strength is V/V in the upper and lower limbs.  Sensation: Dec pin prick and temp to ankles, intact in the upper extremities distally. Vibration and proprioception intact distally.    Reflex Exam:  DTR's:   Deep tendon reflexes in the lower extremities are absent bilaterally. DTRs in the uppers are 1+.   Assessment/Plan: 55 y.o. male here as a referral from Dr. Claiborne Billings for peripheral polyneuropathy. He was recently diagnosed with diabetes with a hemoglobin A1c of over 11 and celiac artery stenosis. He had lost 30 pounds before being diagnosed and has lost 50 pounds altogether. He describes low back pain and right sided radicular symptoms radiating posteriorly down to the right foot. He denies any paresthesias in the other limbs but does report coldness in the feet and hands.   Micheal Richardson does have a distal peripheral polyneuropathy, more small fiber on clinical exam however on EMG/nerve conduction studies showed sensory motor involvement. This is likely due to  his diabetes, unclear how long it was uncontrolled for, ordered extensive peripheral neuropathy screen including paraneoplastic screen given his weight loss which was negative The right-sided leg symptoms do sounded like S1 nerve root involvement related to his right L5-S1 paracentral disc herniation and are unlikely caused by his distal axonal sensorimotor peripheral polyneuropathy - resolved with ESI.  -Peripheral Neuropathy: extensive peripheral neuropathy screen including paraneoplastic screen given his weight loss was negative -Right-sided leg radicular symptoms: do sound like S1 nerve root involvement related to his right L5-S1 paracentral disc herniation - resolved with ESI by Dr. Brien Few - Continue Cymbalta for pain - New problem RLS: Start Neurrpo and check cbc and ferritin  Sarina Ill, MD  [facility] [address] [place], [place] [address]  Phone [phone] Fax [phone]  A total of 30 minutes was spent face-to-face with this Micheal Richardson. Over half this time was spent on counseling Micheal Richardson on the diabetic neuropathy and RLS diagnosis and different diagnostic and therapeutic options available.

## 2015-09-01 LAB — CBC
HEMATOCRIT: 41.4 % (ref 37.5–51.0)
HEMOGLOBIN: 14.1 g/dL (ref 12.6–17.7)
MCH: 31.5 pg (ref 26.6–33.0)
MCHC: 34.1 g/dL (ref 31.5–35.7)
MCV: 93 fL (ref 79–97)
Platelets: 232 10*3/uL (ref 150–379)
RBC: 4.47 x10E6/uL (ref 4.14–5.80)
RDW: 12.9 % (ref 12.3–15.4)
WBC: 6.9 10*3/uL (ref 3.4–10.8)

## 2015-09-01 LAB — FERRITIN: Ferritin: 413 ng/mL — ABNORMAL HIGH (ref 30–400)

## 2015-09-12 ENCOUNTER — Ambulatory Visit: Payer: 59 | Admitting: Neurology

## 2015-11-22 ENCOUNTER — Telehealth: Payer: Self-pay | Admitting: Neurology

## 2015-11-22 NOTE — Telephone Encounter (Signed)
I called pt back. No answer, left a message on pt's home phone asking him to call me back.

## 2015-11-22 NOTE — Telephone Encounter (Signed)
Spoke to pt's wife (per DPR). When they picked up pt's refill on cymbalta yesterday, it was $160, instead of the usual $8. They called [employer], who informed them that their formulary had changed and cymbalta is no longer covered and is a tier 3 medication. Pt's wife is asking for a new medication that is similar to cymbalta, but could not give me the name of the medication that would be covered by [facility] that [facility]. I advised pt's wife that usually this situation requires a prior authorization or perhaps a tier exemption request. Usually the pharmacy or the insurance company notifies us that this paperwork needs to be completed, but I can't find in pt's chart where we have been notified. Pt's wife says that the cymbalta is working well for him. I offered to pt's wife that maybe the best thing to do is contact [employer] and ask them specifically what they need for this drug to be covered, and maybe fax us a prior authorization request or a tier exemption request, or provide us with drugs that will be covered. Pt's wife agreed. She wants to call [facility] and find out if a pa/tier exemption is required before changing drugs is discussed. I gave pt's wife our fax number in case [employer] was willing to fax us more information.

## 2015-11-22 NOTE — Telephone Encounter (Signed)
Patient is calling regarding medication DULoxetine (CYMBALTA) 60 MG capsule. The patient states this is a 3 tier medication and is too expensive and would like another medication called in to replace it. Please call to [facility] on [address] [place]. I advised Dr. Lucia GaskinsAhern is out of the office and I will send to the work in doctor.

## 2015-11-23 ENCOUNTER — Other Ambulatory Visit: Payer: Self-pay | Admitting: Neurology

## 2015-11-23 MED ORDER — VENLAFAXINE HCL ER 150 MG PO CP24: 150.0000 mg | ORAL_CAPSULE | Freq: Every day | ORAL | Status: DC

## 2015-11-23 NOTE — Telephone Encounter (Signed)
Patient is calling back and states he needs a new Rx called in for Venlafaxine which is a generic for Cymbalta and a Tier 1 drug . Please call Venlafaxine to [facility] on [address] [place]. Thank you.

## 2015-11-23 NOTE — Telephone Encounter (Signed)
Dr Epimenio FootSater- are you okay with calling this different medication in? See phone notes below. His insurance does not cover cymbalta. Sounds like they will cover venlafaxine instead.  Dr Lucia GaskinsAhern will not be back in the office until next Tuesday. Or would you rather wait until Dr Lucia GaskinsAhern is back in the office?

## 2015-11-23 NOTE — Telephone Encounter (Signed)
We can change him from duloxetine (Cymbalta) 60 mg to venlafaxine ER 150 mg daily.

## 2015-11-23 NOTE — Telephone Encounter (Signed)
I went ahead and set the order in to the pharmacy on record

## 2016-05-26 ENCOUNTER — Other Ambulatory Visit: Payer: Self-pay | Admitting: Neurology

## 2016-06-04 ENCOUNTER — Telehealth: Payer: Self-pay | Admitting: Neurology

## 2016-06-04 MED ORDER — VENLAFAXINE HCL ER 150 MG PO CP24: 150.0000 mg | ORAL_CAPSULE | Freq: Every day | ORAL | 5 refills | Status: DC

## 2016-06-04 NOTE — Telephone Encounter (Signed)
Sent refill request electronically to pt pharmacy. Dr Lucia GaskinsAhern approved.

## 2016-06-04 NOTE — Telephone Encounter (Addendum)
Patient called to request refill of venlafaxine XR (EFFEXOR-XR) 150 MG 24 hr capsule [facility] [address] Patient adds, he ran out of this medication Saturday.

## 2016-06-18 ENCOUNTER — Encounter: Payer: Self-pay | Admitting: Neurology

## 2016-06-18 ENCOUNTER — Ambulatory Visit (INDEPENDENT_AMBULATORY_CARE_PROVIDER_SITE_OTHER): Payer: 59 | Admitting: Neurology

## 2016-06-18 VITALS — BP 125/78 | HR 81 | Ht 71.0 in | Wt 166.6 lb

## 2016-06-18 DIAGNOSIS — G2581 Restless legs syndrome: Secondary | ICD-10-CM | POA: Diagnosis not present

## 2016-06-18 DIAGNOSIS — E0842 Diabetes mellitus due to underlying condition with diabetic polyneuropathy: Secondary | ICD-10-CM | POA: Diagnosis not present

## 2016-06-18 MED ORDER — VENLAFAXINE HCL ER 150 MG PO CP24: 150.0000 mg | ORAL_CAPSULE | Freq: Every day | ORAL | 6 refills | Status: DC

## 2016-06-18 MED ORDER — VENLAFAXINE HCL ER 75 MG PO CP24: 75.0000 mg | ORAL_CAPSULE | Freq: Every day | ORAL | 6 refills | Status: DC

## 2016-06-18 NOTE — Progress Notes (Signed)
[id number]    Provider:  Dr Jaynee Eagles Referring Provider: Delilah Shan, MD Primary Care Physician:  Micheal Simmer, MD   CC: Peripheral polyneuropathy  Interval history 06/18/2016: Patient with severe neuropathy secondary to uncontrolled diabetes as well as RLS.  Patient is on Effexor 53m and feeling well. He is on a Neupro patch for RLS occasionally. Discussed doses.   Interval history 08/31/2015: He has gained weight. Feeling better. Pain in the legs is ok, he still has some tingling but he is doing wll on the cymbalta 53m When he is sleeping at night his leg feel restless. He is constabtly tapping and moving legs. He had ESI with Dr. BaBrien Fewnd felt much better for his radiculopathy in the pelvic joint. He played golf last Friday.   HPI: Micheal Moruas a 89.o. male here as a referral from Dr. KeClaiborne Billingsor peripheral polyneuropathy. He was recently diagnosed with diabetes with a hemoglobin A1c. 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 He had lost 30 pounds before being diagnosed and has lost 50 pounds altogether. Patient has numbness and tingling in the feet which started back in June. Started after lifting water out of the back of the car and afterwards with back pain, numbness and tinlging on the right foot at the toes. Pain shoots down the side of the right leg through the thigh and all the way down the lateral part of the leg to the toes. He was diagnosed with diabetes in June as well. His HgbA1c was 11.2 and his glucose was 375. He has had a 50 pound weight loss. His primary care has been evaluating patient for this weight loss. Patient has an endocrinologist who has worked him up for possible malignancy, has had extensive imaging and pet scans and colonoscopy. He has not had an MRI of the brain. He has had B12 and folate and TSH labs and has been tested extensively. He describes no cramping in the feet. He feels weakness distally in the right foot. He has no symptoms in the left  leg foot. The pad of the right foot feels swollen. Balance is fine. His hands and feet get cold. No paresthesias or numbness in the left toes or any fingers. No other focal neurologic symptoms.  Reviewed notes, labs and imaging from outside physicians, which showed: [facility] He was seen there for severe right-sided low back pain shooting into the right lower extremity into the sole of the foot. He has had L4/L5, L5-S1 and [facility] whatsoever. He had an MRI revealing a small right L5-S1 paracentral disc protrusion with S1 encroachment in the lateral recess. During this time he was also diagnosed with diabetes in June with rather high elevated sugars with an A1c in the 11 range. He was also experiencing rapid weight loss which unfortunately continues. He was sent to vascular surgery for celiac artery stenosis seen on an abdominal CT scan.  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 Findings are fairly symmetric bilaterally which included absent sural sensory responses, prolonged motor latencies with leg nerve conductions slowing, chronic neurogenic motor unit changes with motor unit dropout in the foot intrinsics. Consistent with diabetes also many other etiologies including a neoplastic process. Study was negative for any lumbar radiculopathy, plexopathy, peripheral tibial or peroneal mononeuropathy as well as myopathy.  Personally reviewed data from the electrodiagnostic laboratory report and agree with conclusion detailed above. Personally reviewed MRI of the lumbar spine images a patient brought on disc and agree with report above.  CMP unremarkable showed creatinine of 0.84. TSH within normal limits.  Review of Systems: Patient complains of  symptoms per HPI as well as the following symptoms: Weight loss, fatigue, numbness, weakness. Pertinent negatives per HPI. All others negative.   Social History   Social History  . Marital status: Married    Spouse name: N/A  . Number of children: N/A  . Years of education: N/A   Occupational History  . Not on file.   Social History Main Topics  . Smoking status: Former Smoker    Packs/day: 1.00    Years: 35.00    Types: Cigarettes    Quit date: 03/15/2013  . Smokeless tobacco: Never Used  . Alcohol use No  . Drug use: No  . Sexual activity: Not on file   Other Topics Concern  . Not on file   Social History Narrative  . No narrative on file    Family History  Problem Relation Age of Onset  . Cancer Mother   . Diabetes Mother   . Heart disease Mother     After age 83  . Cancer Father   . Diabetes Father   . Diabetes Brother   . Neuropathy Neg Hx     Past Medical History:  Diagnosis Date  . Diabetes mellitus without complication ([place])   . Peripheral vascular disease [facility])     Past Surgical History:  Procedure Laterality Date  . EPIDURAL BLOCK INJECTION  05-16-15   Back    Current Outpatient Prescriptions  Medication Sig Dispense Refill  . Blood Glucose Monitoring Suppl (ONETOUCH VERIO FLEX SYSTEM) W/DEVICE KIT 1 each by Does not apply route.    . metFORMIN (GLUCOPHAGE) 500 MG tablet Take 2,000 mg by mouth.     Glory Rosebush DELICA LANCETS 98X MISC USE TID  5  . venlafaxine XR (EFFEXOR-XR) 150 MG 24 hr capsule Take 1 capsule (150 mg total) by mouth daily with breakfast. 30 capsule 5  . Rotigotine (NEUPRO) 1 MG/24HR PT24 Place 1 patch (1 mg total) onto the skin at bedtime as needed. (Patient not taking: Reported on 06/18/2016) 30 patch 11   No current facility-administered medications for this visit.     Allergies as of 06/18/2016 - Review Complete 06/18/2016  Allergen Reaction Noted  . Prednisone Other (See Comments) 05/29/2015    Vitals: BP 125/78 (BP  Location: Right Arm, Patient Position: Sitting, Cuff Size: Normal)   Pulse 81   Ht _0  (1.803 m)   Wt 166 lb 9.6 oz (75.6 kg)   SpO2 98%   BMI 23.24 kg/m  Last Weight:  Wt Readings from Last 1 Encounters:  06/18/16 166 lb 9.6 oz (75.6 kg)   Last Height:   Ht Readings from Last 1 Encounters:  06/18/16 _1  (1.803 m)    Physical exam: Exam: Gen: NAD, conversant  CV: RRR, no MRG. No Carotid Bruits. No peripheral edema, warm, nontender Eyes: Conjunctivae clear without exudates or hemorrhage  Neuro: Detailed Neurologic Exam  Speech:  Speech is normal; fluent and spontaneous with normal comprehension.  Cognition:  The patient is oriented to person, place, and time;   recent and remote memory intact;   language fluent;   normal attention, concentration,   fund of knowledge Cranial Nerves:  The pupils are equal, round, and  reactive to light. Visual fields are full to finger confrontation. Extraocular movements are intact. Trigeminal sensation is intact and the muscles of mastication are normal. The face is symmetric. The palate elevates in the midline. Hearing intact. Voice is normal. Shoulder shrug is normal. The tongue has normal motion without fasciculations.   Motor Observation:  Generalized atrophy. no involuntary movements noted. Tone:  Normal muscle tone.   Posture:  Posture is normal. normal erect   Strength:  left deltoid 4+/5, Right deltoid 4/5 otherwise strength is V/V in the upper and lower limbs.    Sensation: Dec pin prick and temp to ankles, intact in the upper extremities distally. Vibration and proprioception intact distally.    Reflex Exam:  DTR's:   Deep tendon reflexes in the lower extremities are absent bilaterally. DTRs in the uppers are 1+.   Assessment/Plan: 55 y.o. male here as a referral from Dr. Claiborne Billings for peripheral polyneuropathy. He was diagnosed with diabetes with a  hemoglobin A1c of over 11 and celiac artery stenosis. He had lost 30 pounds before being diagnosed and has lost 50 pounds altogether. He describes low back pain and right sided radicular symptoms radiating posteriorly down to the right foot. He denies any paresthesias in the other limbs but does report coldness in the feet and hands likely diabetic neuropathy  Patient does have a distal peripheral polyneuropathy, more small fiber on clinical exam however on EMG/nerve conduction studies showed sensory motor involvement. This is likely due to his diabetes, unclear how long it was uncontrolled for, ordered extensive peripheral neuropathy screen including paraneoplastic screen given his weight loss which was negative The right-sided leg symptoms do sounded like S1 nerve root involvement related to his right L5-S1 paracentral disc herniation and are unlikely caused by his distal axonal sensorimotor peripheral polyneuropathy - resolved with ESI.  -Peripheral Neuropathy: extensive peripheral neuropathy screen including paraneoplastic screen given his weight loss was negative. Continue Effexore -Right-sided leg radicular symptoms: do sound like S1 nerve root involvement related to his right L5-S1 paracentral disc herniation - resolved with ESI by Dr. Brien Few - New problem RLS: Stable

## 2016-06-18 NOTE — Patient Instructions (Signed)
Remember to drink plenty of fluid, eat healthy meals and do not skip any meals. Try to eat protein with a every meal and eat a healthy snack such as fruit or nuts in between meals. Try to keep a regular sleep-wake schedule and try to exercise daily, particularly in the form of walking, 20-30 minutes a day, if you can.    Please also call us for any test results so we can go over those with you on the phone.  My clinical assistant and will answer any of your questions and relay your messages to me and also relay most of my messages to you.   Our phone number is [phone]. We also have an after hours call service for urgent matters and there is a physician on-call for urgent questions. For any emergencies you know to call 911 or go to the nearest emergency room

## 2016-12-01 ENCOUNTER — Other Ambulatory Visit: Payer: Self-pay | Admitting: Neurology

## 2016-12-04 IMAGING — CT CT ABD-PELV W/ CM
2 of 5 series · 15 of 46 positions shown, 17 images · IV contrast (APPLIED)
Comparison: None.

CLINICAL DATA: Right-sided abdominal pain

EXAM:
CT ABDOMEN AND PELVIS WITH CONTRAST
TECHNIQUE: Multidetector CT imaging of the abdomen and pelvis was performed
using the standard protocol following bolus administration of
intravenous contrast. Oral contrast was also administered.
CONTRAST:  100mL OMNIPAQUE IOHEXOL 300 MG/ML  SOLN

[Series 2: abd/pelvis 5.0 b31f · axial · 0.72mm/px · z∈[-456,-16]mm · 12 of 100 slices shown, 14 images]
[im 6/100  soft-tissue]
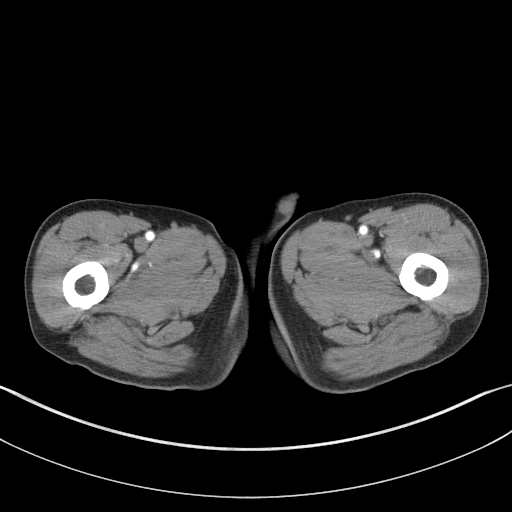
[im 6/100  bone]
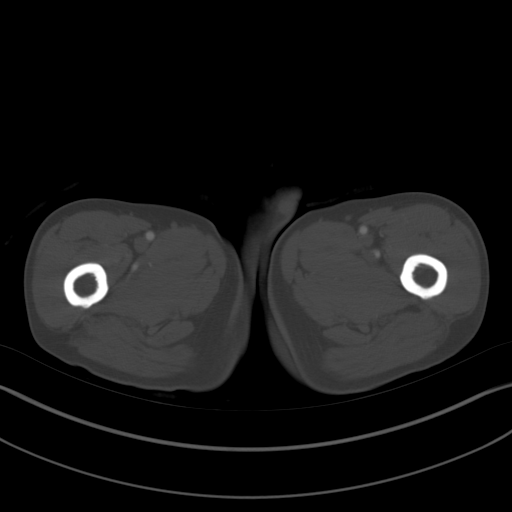
[im 17/100  soft-tissue]
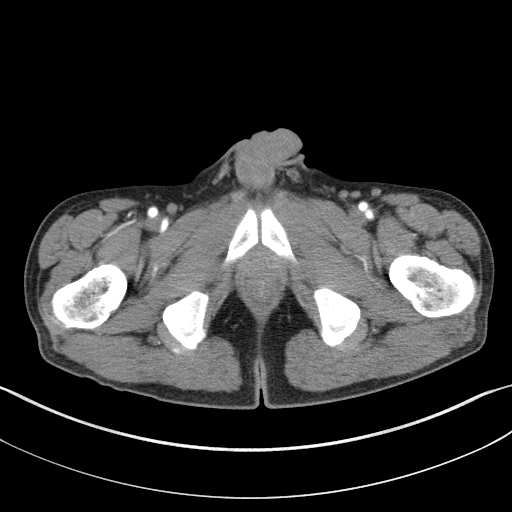
[im 23/100  soft-tissue]
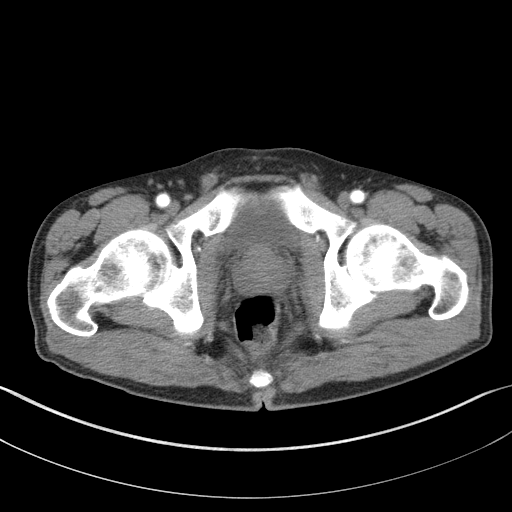
[im 28/100  soft-tissue]
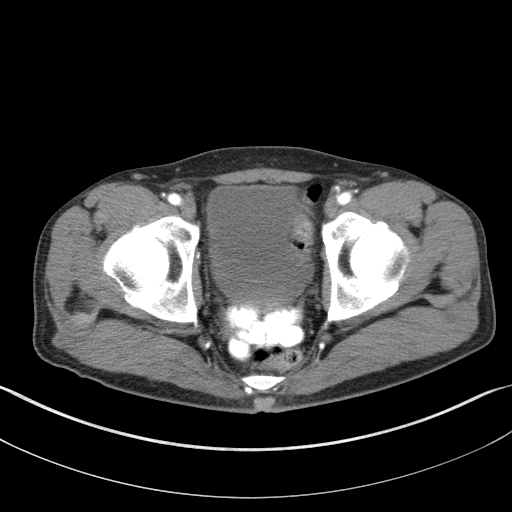
[im 39/100  soft-tissue]
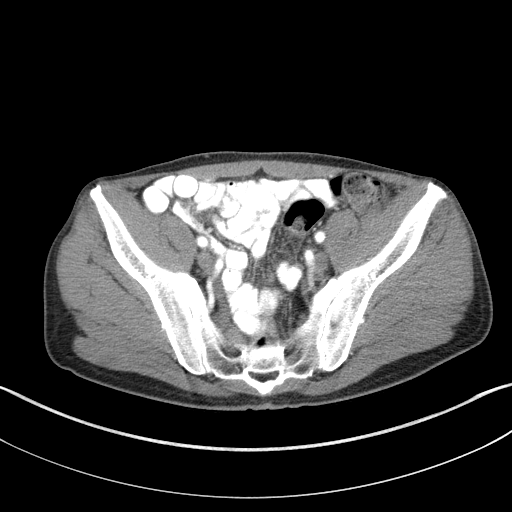
[im 45/100  soft-tissue]
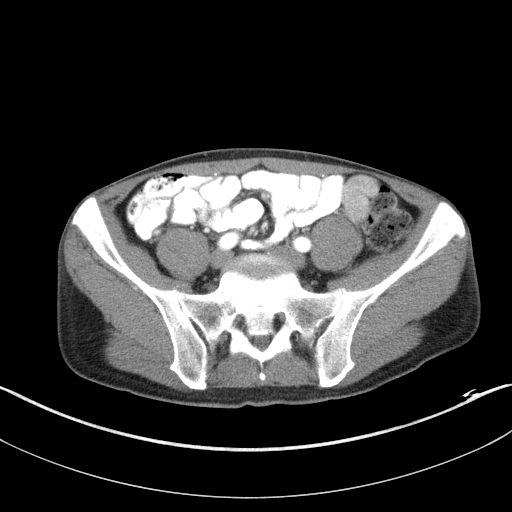
[im 56/100  soft-tissue]
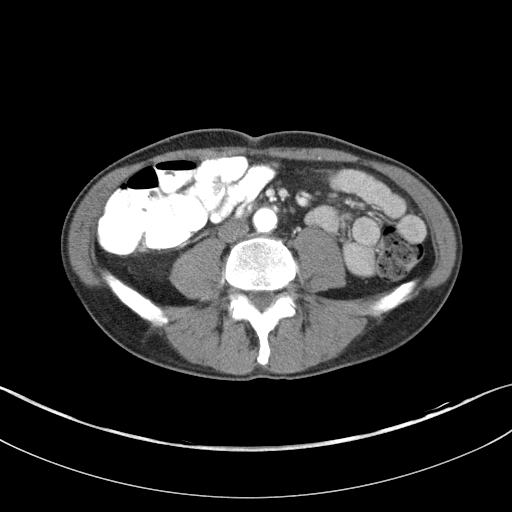
[im 61/100  soft-tissue]
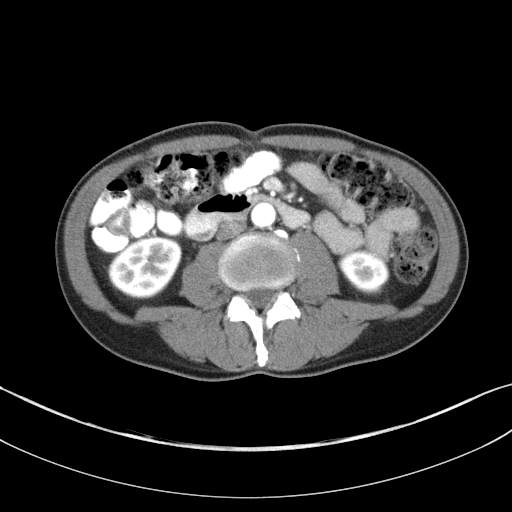
[im 72/100  soft-tissue]
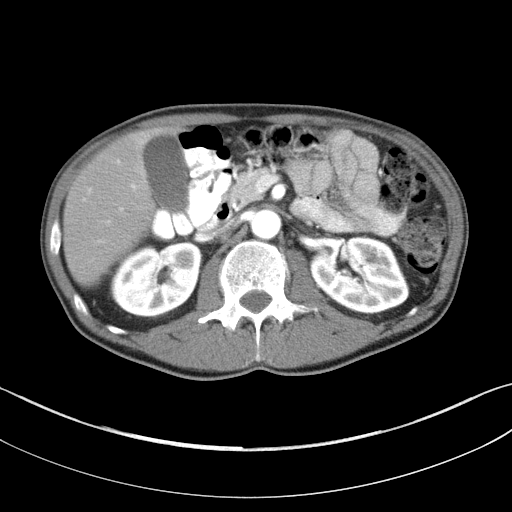
[im 72/100  bone]
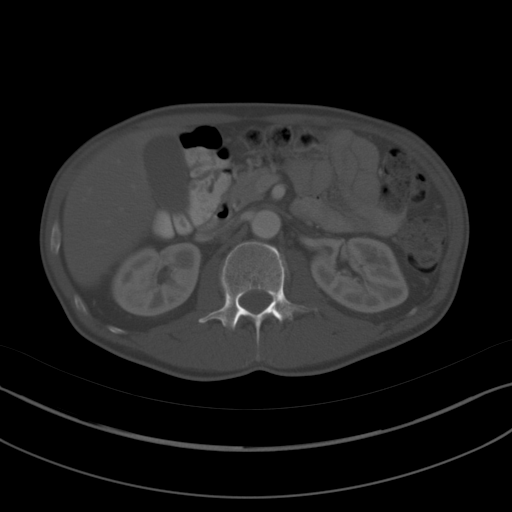
[im 78/100  soft-tissue]
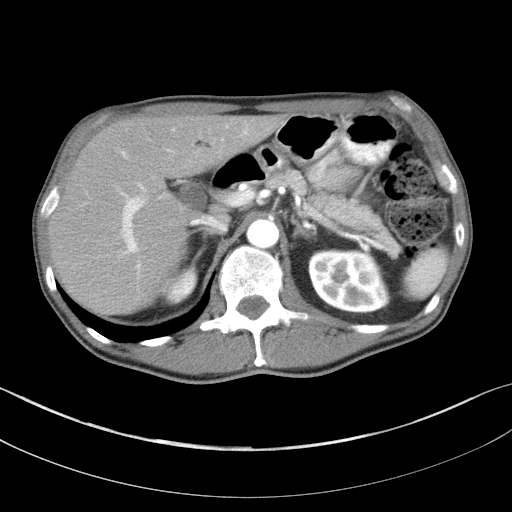
[im 83/100  soft-tissue]
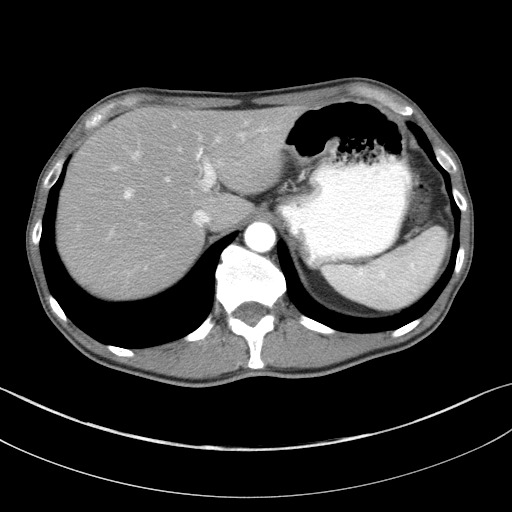
[im 94/100  soft-tissue]
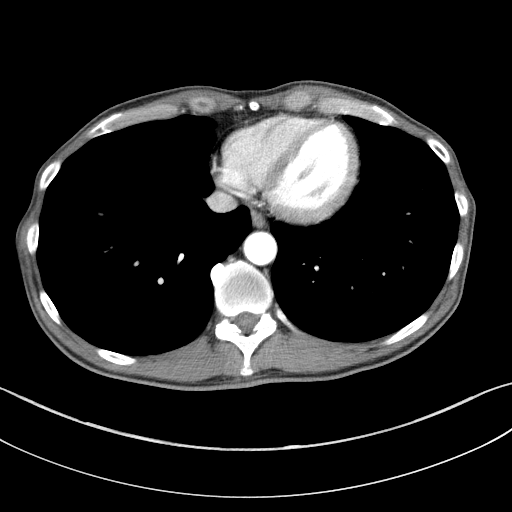

[Series 5: abd/pelvis 3.0 coronal · coronal · 0.80mm/px · 3 of 80 slices shown]
[im 27/80  soft-tissue]
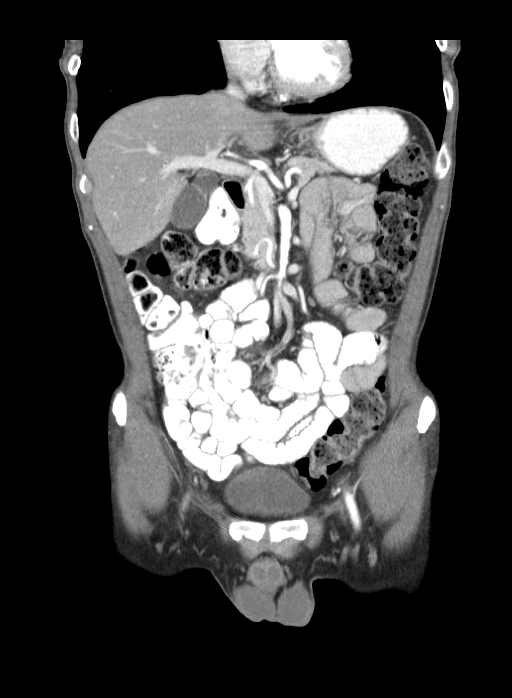
[im 36/80  soft-tissue]
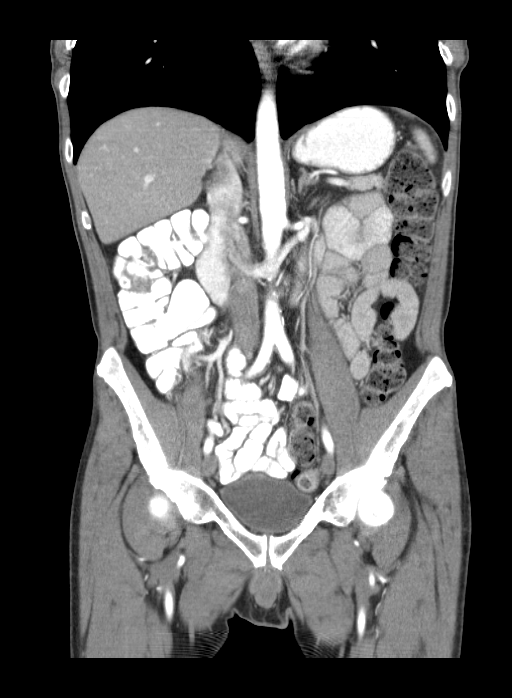
[im 44/80  soft-tissue]
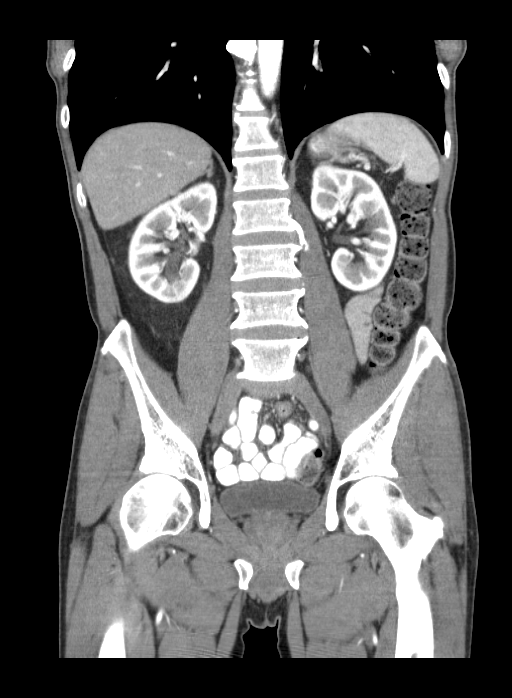

[15 of 46 positions shown; findings below may reference images not displayed]

FINDINGS: Lower chest:  Lung bases are clear.

Hepatobiliary: There is hepatic steatosis. No focal liver lesions
are identified. The gallbladder wall is not thickened. There is no
biliary duct dilatation.

Pancreas: No mass or inflammatory focus.

Spleen: No splenic lesion identified.

Adrenals/Urinary Tract: Adrenals appear normal bilaterally. There is
no renal mass or hydronephrosis on either side. There is no renal or
ureteral calculus on either side. Urinary bladder is midline with
normal wall thickness.

Stomach/Bowel: There is diffuse stool throughout the colon. There is
no bowel wall or mesenteric thickening. There is no bowel
obstruction. No free air or portal venous air. No bowel pneumatosis.

Vascular/Lymphatic: There is no evidence of abdominal aortic
aneurysm. There is a circumaortic left renal vein, an anatomic
variant. There is a focal high-grade stenosis at the origin of the
celiac axis. The superior mesenteric artery is widely patent. The
inferior mesenteric artery is patent although diminutive. Renal
arteries widely patent bilaterally. There is no adenopathy in the
abdomen or pelvis.

Reproductive: Prostate is normal in size and contour. There is no
pelvic mass or pelvic fluid collection.

Other: No paraspinous lesions are identified. Appendix appears
normal. There is no abscess or ascites in the abdomen or pelvis.

Musculoskeletal: There are no blastic or lytic bone lesions. No
intramuscular or abdominal wall lesion. There is degenerative change
in the lumbar spine.
IMPRESSION: Diffuse stool throughout colon. No bowel wall or mesenteric
thickening. No bowel obstruction. No abscess. Appendix appears
normal.

No paraspinous or pelvic mass lesion.

No renal or ureteral calculus. No hydronephrosis. Urinary bladder
wall thickness normal.

High-grade stenosis at the origin of the celiac axis. Superior
mesenteric artery widely patent. No evidence suggesting bowel
ischemia.

Hepatic steatosis.

## 2017-01-08 ENCOUNTER — Telehealth: Payer: Self-pay | Admitting: Neurology

## 2017-01-08 NOTE — Telephone Encounter (Signed)
[facility] and [facility] the original referral our office received for him to come here. They are refusing to pay for the office visit until we send this over. [facility] Patient would like a call from our office to confirm that we sent this referral over to his insurance once it is completed.

## 2017-05-12 ENCOUNTER — Emergency Department (INDEPENDENT_AMBULATORY_CARE_PROVIDER_SITE_OTHER)
Admission: EM | Admit: 2017-05-12 | Discharge: 2017-05-12 | Disposition: A | Discharge status: Home / Self Care | Payer: 59 | Source: Home / Self Care | Attending: Family Medicine | Admitting: Family Medicine

## 2017-05-12 ENCOUNTER — Encounter: Payer: Self-pay | Admitting: *Deleted

## 2017-05-12 DIAGNOSIS — S39012A Strain of muscle, fascia and tendon of lower back, initial encounter: Secondary | ICD-10-CM

## 2017-05-12 DIAGNOSIS — M25511 Pain in right shoulder: Secondary | ICD-10-CM

## 2017-05-12 MED ORDER — IBUPROFEN 600 MG PO TABS: 600.0000 mg | ORAL_TABLET | Freq: Four times a day (QID) | ORAL | 0 refills | Status: DC | PRN

## 2017-05-12 MED ORDER — HYDROCODONE-ACETAMINOPHEN 5-325 MG PO TABS: 1.0000 | ORAL_TABLET | Freq: Four times a day (QID) | ORAL | 0 refills | Status: DC | PRN

## 2017-05-12 MED ORDER — CYCLOBENZAPRINE HCL 5 MG PO TABS: 5.0000 mg | ORAL_TABLET | Freq: Two times a day (BID) | ORAL | 0 refills | Status: DC | PRN
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
## 2017-06-30 ENCOUNTER — Encounter (HOSPITAL_BASED_OUTPATIENT_CLINIC_OR_DEPARTMENT_OTHER): Payer: Self-pay | Admitting: *Deleted

## 2017-06-30 ENCOUNTER — Emergency Department (HOSPITAL_BASED_OUTPATIENT_CLINIC_OR_DEPARTMENT_OTHER): Payer: 59

## 2017-06-30 ENCOUNTER — Other Ambulatory Visit: Payer: Self-pay

## 2017-06-30 ENCOUNTER — Emergency Department (HOSPITAL_BASED_OUTPATIENT_CLINIC_OR_DEPARTMENT_OTHER)
Admission: EM | Admit: 2017-06-30 | Discharge: 2017-07-01 | Disposition: A | Discharge status: Home / Self Care | Payer: 59 | Attending: Emergency Medicine | Admitting: Emergency Medicine

## 2017-06-30 DIAGNOSIS — Z7984 Long term (current) use of oral hypoglycemic drugs: Secondary | ICD-10-CM | POA: Diagnosis not present

## 2017-06-30 DIAGNOSIS — E119 Type 2 diabetes mellitus without complications: Secondary | ICD-10-CM | POA: Diagnosis not present

## 2017-06-30 DIAGNOSIS — R1031 Right lower quadrant pain: Secondary | ICD-10-CM | POA: Diagnosis present

## 2017-06-30 DIAGNOSIS — Z79899 Other long term (current) drug therapy: Secondary | ICD-10-CM | POA: Insufficient documentation

## 2017-06-30 DIAGNOSIS — N201 Calculus of ureter: Secondary | ICD-10-CM | POA: Diagnosis not present

## 2017-06-30 DIAGNOSIS — Z87891 Personal history of nicotine dependence: Secondary | ICD-10-CM | POA: Insufficient documentation

## 2017-06-30 LAB — CBC
HCT: 44.4 % (ref 39.0–52.0)
Hemoglobin: 15.4 g/dL (ref 13.0–17.0)
MCH: 31.2 pg (ref 26.0–34.0)
MCHC: 34.7 g/dL (ref 30.0–36.0)
MCV: 89.9 fL (ref 78.0–100.0)
PLATELETS: 234 10*3/uL (ref 150–400)
RBC: 4.94 MIL/uL (ref 4.22–5.81)
RDW: 12.7 % (ref 11.5–15.5)
WBC: 15.2 10*3/uL — AB (ref 4.0–10.5)

## 2017-06-30 LAB — COMPREHENSIVE METABOLIC PANEL
ALT: 15 U/L — AB (ref 17–63)
AST: 16 U/L (ref 15–41)
Albumin: 4.3 g/dL (ref 3.5–5.0)
Alkaline Phosphatase: 98 U/L (ref 38–126)
Anion gap: 10 (ref 5–15)
BILIRUBIN TOTAL: 1 mg/dL (ref 0.3–1.2)
BUN: 29 mg/dL — AB (ref 6–20)
CALCIUM: 8.9 mg/dL (ref 8.9–10.3)
CHLORIDE: 103 mmol/L (ref 101–111)
CO2: 21 mmol/L — ABNORMAL LOW (ref 22–32)
CREATININE: 1.35 mg/dL — AB (ref 0.61–1.24)
GFR, EST NON AFRICAN AMERICAN: 58 mL/min — AB (ref >60)
Glucose, Bld: 206 mg/dL — ABNORMAL HIGH (ref 65–99)
Potassium: 4.1 mmol/L (ref 3.5–5.1)
Sodium: 134 mmol/L — ABNORMAL LOW (ref 135–145)
TOTAL PROTEIN: 7.4 g/dL (ref 6.5–8.1)

## 2017-06-30 LAB — DIFFERENTIAL
BASOS ABS: 0 10*3/uL (ref 0.0–0.1)
BASOS PCT: 0 %
Eosinophils Absolute: 0 10*3/uL (ref 0.0–0.7)
Eosinophils Relative: 0 %
LYMPHS PCT: 11 %
Lymphs Abs: 1.7 10*3/uL (ref 0.7–4.0)
MONO ABS: 1.4 10*3/uL — AB (ref 0.1–1.0)
Monocytes Relative: 9 %
NEUTROS ABS: 12.6 10*3/uL — AB (ref 1.7–7.7)
NEUTROS PCT: 80 %

## 2017-06-30 LAB — LIPASE, BLOOD: LIPASE: 23 U/L (ref 11–51)

## 2017-06-30 MED ORDER — ONDANSETRON HCL 4 MG/2ML IJ SOLN
4.0000 mg | Freq: Once | INTRAMUSCULAR | Status: AC
  Administered 2017-06-30: 4 mg via INTRAVENOUS
  Filled 2017-06-30: qty 2

## 2017-06-30 MED ORDER — FENTANYL CITRATE (PF) 100 MCG/2ML IJ SOLN
100.0000 ug | Freq: Once | INTRAMUSCULAR | Status: AC
  Administered 2017-06-30: 100 ug via INTRAVENOUS
  Filled 2017-06-30: qty 2

## 2017-06-30 MED ORDER — ONDANSETRON 4 MG PO TBDP
4.0000 mg | ORAL_TABLET | Freq: Once | ORAL | Status: AC | PRN
  Administered 2017-06-30: 4 mg via ORAL
  Filled 2017-06-30: qty 1

## 2017-06-30 NOTE — ED Triage Notes (Signed)
Abdominal pain and back pain since yesterday. Pain is sharp.

## 2017-06-30 NOTE — ED Notes (Signed)
ED Provider at bedside. 

## 2017-07-01 LAB — URINALYSIS, ROUTINE W REFLEX MICROSCOPIC
Bilirubin Urine: NEGATIVE
KETONES UR: 15 mg/dL — AB
LEUKOCYTES UA: NEGATIVE
Nitrite: NEGATIVE
PH: 5.5 (ref 5.0–8.0)
Protein, ur: NEGATIVE mg/dL
Specific Gravity, Urine: 1.02 (ref 1.005–1.030)

## 2017-07-01 LAB — URINALYSIS, MICROSCOPIC (REFLEX)

## 2017-07-01 MED ORDER — HYDROMORPHONE HCL 2 MG PO TABS: 2.0000 mg | ORAL_TABLET | ORAL | 0 refills | Status: AC | PRN

## 2017-07-01 MED ORDER — ONDANSETRON 8 MG PO TBDP: 8.0000 mg | ORAL_TABLET | Freq: Three times a day (TID) | ORAL | 0 refills | Status: AC | PRN

## 2017-07-01 MED ORDER — HYDROMORPHONE HCL 1 MG/ML IJ SOLN
1.0000 mg | Freq: Once | INTRAMUSCULAR | Status: AC
  Administered 2017-07-01: 1 mg via INTRAVENOUS
  Filled 2017-07-01: qty 1

## 2017-07-01 MED ORDER — IOPAMIDOL (ISOVUE-300) INJECTION 61%
100.0000 mL | Freq: Once | INTRAVENOUS | Status: AC | PRN
  Administered 2017-07-01: 100 mL via INTRAVENOUS

## 2017-07-01 MED ORDER — SODIUM CHLORIDE 0.9 % IV BOLUS (SEPSIS)
1000.0000 mL | Freq: Once | INTRAVENOUS | Status: AC
  Administered 2017-07-01: 1000 mL via INTRAVENOUS

## 2017-07-01 MED ORDER — ONDANSETRON HCL 4 MG/2ML IJ SOLN
INTRAMUSCULAR | Status: DC
Start: 2017-07-01 — End: 2017-07-01
  Filled 2017-07-01: qty 2

## 2017-07-01 NOTE — ED Provider Notes (Addendum)
[facility] Provider Note: Georgena Spurling, MD, FACEP  CSN: [id number] MRN: [id number] ARRIVAL: 06/30/17 at 2206 ROOM: [place]  Abdominal Pain   HISTORY OF PRESENT ILLNESS  07/01/17 12:53 AM Elisha Cooksey is a 56 y.o. male with a 2-day history of right-sided abdominal pain.  The onset has been gradual and intermittent.  It has been severe at times although he rates it as a 6 out of 10 presently after receiving a dose of fentanyl.  He describes the pain as sharp and worse with movement or palpation.  There is been associated nausea and vomiting.  He is only had one episode of vomiting in the past 24 hours.  He denies diarrhea but does feel constipated.  He has had chills and subjective fever.  He has had decreased appetite.  He denies dark urine.  Consultation with the [facility] state controlled substances database reveals the patient has received 1 prescription for opioids in the past year.   Past Medical History:  Diagnosis Date  . Diabetes mellitus without complication ([place])   . Peripheral vascular disease [facility])     Past Surgical History:  Procedure Laterality Date  . EPIDURAL BLOCK INJECTION  05-16-15   Back    Family History  Problem Relation Age of Onset  . Cancer Mother   . Diabetes Mother   . Heart disease Mother        After age 30  . Cancer Father   . Diabetes Father   . Diabetes Brother   . Neuropathy Neg Hx     Social History   Tobacco Use  . Smoking status: Former Smoker    Packs/day: 1.00    Years: 35.00    Pack years: 35.00    Types: Cigarettes    Last attempt to quit: 03/15/2013    Years since quitting: 4.2  . Smokeless tobacco: Never Used  Substance Use Topics  . Alcohol use: No    Alcohol/week: 0.0 oz  . Drug use: No    Prior to Admission medications   Medication Sig Start Date End Date Taking? Authorizing Provider  Blood Glucose Monitoring Suppl (ONETOUCH VERIO FLEX SYSTEM) W/DEVICE KIT 1 each by Does  not apply route. 03/02/15  Yes [provider]  metFORMIN (GLUCOPHAGE) 500 MG tablet Take 2,000 mg by mouth.  04/18/15  Yes [provider]  pioglitazone (ACTOS) 15 MG tablet Take 15 mg daily by mouth.   Yes [provider]  venlafaxine XR (EFFEXOR-XR) 150 MG 24 hr capsule TAKE 1 CAPSULE(150 MG) BY MOUTH DAILY WITH BREAKFAST 12/02/16  Yes Melvenia Beam, MD  cyclobenzaprine (FLEXERIL) 5 MG tablet Take 1-2 tablets (5-10 mg total) by mouth 2 (two) times daily as needed for muscle spasms. 05/12/17   Noe Gens, PA-C  HYDROcodone-acetaminophen (NORCO/VICODIN) 5-325 MG tablet Take 1 tablet by mouth every 6 (six) hours as needed for moderate pain or severe pain. Take 1 tab by mouth every 6 hours a 05/12/17   Noe Gens, PA-C  ibuprofen (ADVIL,MOTRIN) 600 MG tablet Take 1 tablet (600 mg total) by mouth every 6 (six) hours as needed. 05/12/17   Noe Gens, PA-C  ONETOUCH DELICA LANCETS 26S MISC USE TID 03/16/15   [provider]    Allergies Prednisone   REVIEW OF SYSTEMS  Negative except as noted here or in the History of Present Illness.   PHYSICAL EXAMINATION  Initial Vital Signs Blood pressure (!) 150/73, pulse 83, temperature  98.2 F (36.8 C), temperature source Oral, resp. rate 20, height 5' 11"  (1.803 m), weight 77.1 kg (170 lb), SpO2 99 %.  Examination General: Well-developed, well-nourished male in no acute distress; appearance consistent with age of record HENT: normocephalic; atraumatic Eyes: pupils equal, round and reactive to light; extraocular muscles intact Neck: supple Heart: regular rate and rhythm Lungs: clear to auscultation bilaterally Abdomen: soft; nondistended; right-sided abdominal pain; no masses or hepatosplenomegaly; bowel sounds present GU: Mild right CVA tenderness Extremities: No deformity; full range of motion; pulses normal Neurologic: Awake, alert and oriented; motor function intact in all extremities and symmetric; no  facial droop Skin: Warm and dry Psychiatric: Normal mood and affect   RESULTS  Summary of this visit's results, reviewed by myself:   EKG Interpretation  Date/Time:    Ventricular Rate:    PR Interval:    QRS Duration:   QT Interval:    QTC Calculation:   R Axis:     Text Interpretation:        Laboratory Studies: Results for orders placed or performed during the hospital encounter of 06/30/17 (from the past 24 hour(s))  Lipase, blood     Status: None   Collection Time: 06/30/17 10:25 PM  Result Value Ref Range   Lipase 23 11 - 51 U/L  Comprehensive metabolic panel     Status: Abnormal   Collection Time: 06/30/17 10:25 PM  Result Value Ref Range   Sodium 134 (L) 135 - 145 mmol/L   Potassium 4.1 3.5 - 5.1 mmol/L   Chloride 103 101 - 111 mmol/L   CO2 21 (L) 22 - 32 mmol/L   Glucose, Bld 206 (H) 65 - 99 mg/dL   BUN 29 (H) 6 - 20 mg/dL   Creatinine, Ser 1.35 (H) 0.61 - 1.24 mg/dL   Calcium 8.9 8.9 - 10.3 mg/dL   Total Protein 7.4 6.5 - 8.1 g/dL   Albumin 4.3 3.5 - 5.0 g/dL   AST 16 15 - 41 U/L   ALT 15 (L) 17 - 63 U/L   Alkaline Phosphatase 98 38 - 126 U/L   Total Bilirubin 1.0 0.3 - 1.2 mg/dL   GFR calc non Af Amer 58 (L) >60 mL/min   GFR calc Af Amer >60 >60 mL/min   Anion gap 10 5 - 15  CBC     Status: Abnormal   Collection Time: 06/30/17 10:25 PM  Result Value Ref Range   WBC 15.2 (H) 4.0 - 10.5 K/uL   RBC 4.94 4.22 - 5.81 MIL/uL   Hemoglobin 15.4 13.0 - 17.0 g/dL   HCT 44.4 39.0 - 52.0 %   MCV 89.9 78.0 - 100.0 fL   MCH 31.2 26.0 - 34.0 pg   MCHC 34.7 30.0 - 36.0 g/dL   RDW 12.7 11.5 - 15.5 %   Platelets 234 150 - 400 K/uL  Differential     Status: Abnormal   Collection Time: 06/30/17 10:25 PM  Result Value Ref Range   Neutrophils Relative % 80 %   Neutro Abs 12.6 (H) 1.7 - 7.7 K/uL   Lymphocytes Relative 11 %   Lymphs Abs 1.7 0.7 - 4.0 K/uL   Monocytes Relative 9 %   Monocytes Absolute 1.4 (H) 0.1 - 1.0 K/uL   Eosinophils Relative 0 %    Eosinophils Absolute 0.0 0.0 - 0.7 K/uL   Basophils Relative 0 %   Basophils Absolute 0.0 0.0 - 0.1 K/uL  Urinalysis, Routine w reflex microscopic  Status: Abnormal   Collection Time: 06/30/17 10:26 PM  Result Value Ref Range   Color, Urine YELLOW YELLOW   APPearance CLEAR CLEAR   Specific Gravity, Urine 1.020 1.005 - 1.030   pH 5.5 5.0 - 8.0   Glucose, UA >=500 (A) NEGATIVE mg/dL   Hgb urine dipstick LARGE (A) NEGATIVE   Bilirubin Urine NEGATIVE NEGATIVE   Ketones, ur 15 (A) NEGATIVE mg/dL   Protein, ur NEGATIVE NEGATIVE mg/dL   Nitrite NEGATIVE NEGATIVE   Leukocytes, UA NEGATIVE NEGATIVE  Urinalysis, Microscopic (reflex)     Status: Abnormal   Collection Time: 06/30/17 10:26 PM  Result Value Ref Range   RBC / HPF 6-30 0 - 5 RBC/hpf   WBC, UA 0-5 0 - 5 WBC/hpf   Bacteria, UA RARE (A) NONE SEEN   Squamous Epithelial / LPF 0-5 (A) NONE SEEN   Imaging Studies: Ct Abdomen Pelvis W Contrast  Result Date: 07/01/2017 CLINICAL DATA:  Sharp left flank and abdominal pain for 2 days. White cell count 15.2. EXAM: CT ABDOMEN AND PELVIS WITH CONTRAST TECHNIQUE: Multidetector CT imaging of the abdomen and pelvis was performed using the standard protocol following bolus administration of intravenous contrast. CONTRAST:  177m ISOVUE-300 IOPAMIDOL (ISOVUE-300) INJECTION 61% COMPARISON:  05/25/2015 FINDINGS: Lower chest: The lung bases are clear. Hepatobiliary: Mild diffuse fatty infiltration of the liver. No focal lesions are identified. Gallbladder and bile ducts are unremarkable. Pancreas: Unremarkable. No pancreatic ductal dilatation or surrounding inflammatory changes. Spleen: Normal in size without focal abnormality. Adrenals/Urinary Tract: There is a 3 mm stone in the mid right ureter at the level of the iliac crest. There is proximal hydronephrosis and hydroureter with stranding around the right kidney and ureter. The distal right ureter is decompressed. The left kidney, left ureter, and the  bladder are unremarkable. No adrenal gland nodules. Stomach/Bowel: Stomach is within normal limits. Appendix appears normal. No evidence of bowel wall thickening, distention, or inflammatory changes. Vascular/Lymphatic: Aortic atherosclerosis. No enlarged abdominal or pelvic lymph nodes. Reproductive: Prostate is unremarkable. Other: No abdominal wall hernia or abnormality. No abdominopelvic ascites. Musculoskeletal: No acute or significant osseous findings. IMPRESSION: 1. 3 mm stone in the mid right ureter with moderate proximal obstruction. 2. Mild diffuse fatty infiltration of the liver. 3. No evidence of bowel obstruction or inflammation. Appendix is normal. 4. Aortic atherosclerosis. Electronically Signed   By: WLucienne CapersM.D.   On: 07/01/2017 02:10    ED COURSE  Nursing notes and initial vitals signs, including pulse oximetry, reviewed.  Vitals:   06/30/17 2220 06/30/17 2224 07/01/17 0207  BP:  (!) 150/73   Pulse:  83 84  Resp:  20 18  Temp:  98.2 F (36.8 C)   TempSrc:  Oral   SpO2:  99% 97%  Weight: 77.1 kg (170 lb)    Height: 5' 11"  (1.803 m)       PROCEDURES    ED DIAGNOSES     ICD-10-CM   1. Ureterolithiasis N20.1        Jessy Calixte, MD 07/01/17 01610   MShanon Rosser MD 07/01/17 [id number]

## 2017-11-23 ENCOUNTER — Other Ambulatory Visit: Payer: Self-pay | Admitting: Neurology

## 2017-11-28 ENCOUNTER — Other Ambulatory Visit: Payer: Self-pay | Admitting: Neurology
